# Patient Record
Sex: Male | Born: 1970 | Race: White | Hispanic: No | Marital: Married | Smoking: Never smoker
Health system: Southern US, Community
[De-identification: ages and names within clinical notes are randomized; demographics above are authoritative.]

## PROBLEM LIST (undated history)

## (undated) DIAGNOSIS — N289 Disorder of kidney and ureter, unspecified: Secondary | ICD-10-CM

---

## 2003-09-14 ENCOUNTER — Emergency Department (HOSPITAL_COMMUNITY): Admission: EM | Admit: 2003-09-14 | Discharge: 2003-09-14 | Payer: Self-pay | Admitting: Emergency Medicine

## 2004-03-08 ENCOUNTER — Ambulatory Visit (HOSPITAL_COMMUNITY): Admission: RE | Admit: 2004-03-08 | Discharge: 2004-03-08 | Payer: Self-pay | Admitting: Family Medicine

## 2006-01-26 ENCOUNTER — Emergency Department (HOSPITAL_COMMUNITY): Admission: EM | Admit: 2006-01-26 | Discharge: 2006-01-26 | Payer: Self-pay | Admitting: Family Medicine

## 2008-02-02 ENCOUNTER — Emergency Department (HOSPITAL_BASED_OUTPATIENT_CLINIC_OR_DEPARTMENT_OTHER): Admission: EM | Admit: 2008-02-02 | Discharge: 2008-02-02 | Payer: Self-pay | Admitting: Emergency Medicine

## 2015-02-24 ENCOUNTER — Encounter (HOSPITAL_COMMUNITY): Payer: Self-pay | Admitting: Emergency Medicine

## 2015-02-24 ENCOUNTER — Emergency Department (HOSPITAL_COMMUNITY)
Admission: EM | Admit: 2015-02-24 | Discharge: 2015-02-24 | Disposition: A | Discharge status: Home / Self Care | Payer: Self-pay | Attending: Emergency Medicine | Admitting: Emergency Medicine

## 2015-02-24 ENCOUNTER — Emergency Department (HOSPITAL_COMMUNITY): Payer: Self-pay

## 2015-02-24 DIAGNOSIS — N201 Calculus of ureter: Secondary | ICD-10-CM | POA: Insufficient documentation

## 2015-02-24 HISTORY — DX: Disorder of kidney and ureter, unspecified: N28.9

## 2015-02-24 LAB — URINALYSIS, ROUTINE W REFLEX MICROSCOPIC
Bilirubin Urine: NEGATIVE
Glucose, UA: NEGATIVE mg/dL
Leukocytes, UA: NEGATIVE
Nitrite: NEGATIVE
Specific Gravity, Urine: 1.025 (ref 1.005–1.030)
Urobilinogen, UA: 0.2 mg/dL (ref 0.0–1.0)
pH: 6.5 (ref 5.0–8.0)

## 2015-02-24 LAB — URINE MICROSCOPIC-ADD ON

## 2015-02-24 LAB — CBC WITH DIFFERENTIAL/PLATELET
Basophils Absolute: 0 10*3/uL (ref 0.0–0.1)
Basophils Relative: 0 %
Eosinophils Absolute: 0.1 10*3/uL (ref 0.0–0.7)
Eosinophils Relative: 1 %
HCT: 42.8 % (ref 39.0–52.0)
Hemoglobin: 14.8 g/dL (ref 13.0–17.0)
Lymphocytes Relative: 11 %
Lymphs Abs: 1.7 10*3/uL (ref 0.7–4.0)
MCH: 31.6 pg (ref 26.0–34.0)
MCHC: 34.6 g/dL (ref 30.0–36.0)
MCV: 91.3 fL (ref 78.0–100.0)
Monocytes Absolute: 1.5 10*3/uL — ABNORMAL HIGH (ref 0.1–1.0)
Monocytes Relative: 9 %
Neutro Abs: 12.9 10*3/uL — ABNORMAL HIGH (ref 1.7–7.7)
Neutrophils Relative %: 79 %
Platelets: 314 10*3/uL (ref 150–400)
RBC: 4.69 MIL/uL (ref 4.22–5.81)
RDW: 12.7 % (ref 11.5–15.5)
WBC: 16.2 10*3/uL — ABNORMAL HIGH (ref 4.0–10.5)

## 2015-02-24 LAB — BASIC METABOLIC PANEL
Anion gap: 6 (ref 5–15)
BUN: 20 mg/dL (ref 6–20)
CO2: 25 mmol/L (ref 22–32)
Calcium: 8.6 mg/dL — ABNORMAL LOW (ref 8.9–10.3)
Chloride: 107 mmol/L (ref 101–111)
Creatinine, Ser: 1.34 mg/dL — ABNORMAL HIGH (ref 0.61–1.24)
GFR calc Af Amer: >60 mL/min (ref >60)
GFR calc non Af Amer: >60 mL/min (ref >60)
Glucose, Bld: 97 mg/dL (ref 65–99)
Potassium: 3.9 mmol/L (ref 3.5–5.1)
Sodium: 138 mmol/L (ref 135–145)

## 2015-02-24 MED ORDER — OXYCODONE-ACETAMINOPHEN 5-325 MG PO TABS: 2.0000 | ORAL_TABLET | ORAL | Status: AC | PRN

## 2015-02-24 MED ORDER — TAMSULOSIN HCL 0.4 MG PO CAPS: 0.4000 mg | ORAL_CAPSULE | Freq: Every day | ORAL | Status: AC

## 2015-02-24 MED ORDER — TAMSULOSIN HCL 0.4 MG PO CAPS
0.4000 mg | ORAL_CAPSULE | Freq: Once | ORAL | Status: AC
  Administered 2015-02-24: 0.4 mg via ORAL
  Filled 2015-02-24: qty 1

## 2015-02-24 MED ORDER — MORPHINE SULFATE (PF) 4 MG/ML IV SOLN
4.0000 mg | INTRAVENOUS | Status: DC | PRN
  Administered 2015-02-24: 4 mg via INTRAVENOUS
  Filled 2015-02-24: qty 1

## 2015-02-24 MED ORDER — CIPROFLOXACIN HCL 500 MG PO TABS: 500.0000 mg | ORAL_TABLET | Freq: Two times a day (BID) | ORAL | Status: AC

## 2015-02-24 MED ORDER — KETOROLAC TROMETHAMINE 30 MG/ML IJ SOLN
30.0000 mg | Freq: Once | INTRAMUSCULAR | Status: AC
  Administered 2015-02-24: 30 mg via INTRAVENOUS
  Filled 2015-02-24: qty 1

## 2015-02-24 MED ORDER — ONDANSETRON 4 MG PO TBDP: 4.0000 mg | ORAL_TABLET | Freq: Three times a day (TID) | ORAL | Status: AC | PRN

## 2015-02-24 MED ORDER — SODIUM CHLORIDE 0.9 % IV BOLUS (SEPSIS)
1000.0000 mL | Freq: Once | INTRAVENOUS | Status: AC
  Administered 2015-02-24: 1000 mL via INTRAVENOUS

## 2015-02-24 MED ORDER — ONDANSETRON HCL 4 MG/2ML IJ SOLN
4.0000 mg | Freq: Once | INTRAMUSCULAR | Status: AC
  Administered 2015-02-24: 4 mg via INTRAVENOUS
  Filled 2015-02-24: qty 2

## 2015-02-24 NOTE — ED Provider Notes (Signed)
CSN: [id number]     Arrival date & time 02/24/15  1750 History   First MD Initiated Contact with Patient 02/24/15 2042     Chief Complaint  Patient presents with  . Back Pain     HPI  Patient was evaluation of flank pain and abdominal pain for the last 3 weeks. History previous kidney stone in [place] many years ago pass it after 2-3 days. Did not require intervention. Similar pain starting 2 weeks ago. Has good days and bad days. Pain is been intermittent. Does not feel as though it is never completely gone away. No fever shakes chills. Started feeling poorly again yesterday with pain presents here. No dysuria frequency or gross hematuria. Nausea and vomiting today, thus he presents here.  Past Medical History  Diagnosis Date  . Renal disorder    History reviewed. No pertinent past surgical history. History reviewed. No pertinent family history. Social History  Substance Use Topics  . Smoking status: Never Smoker   . Smokeless tobacco: None  . Alcohol Use: Yes     Comment: occasional    Review of Systems  Constitutional: Negative for fever, chills, diaphoresis, appetite change and fatigue.  HENT: Negative for mouth sores, sore throat and trouble swallowing.   Eyes: Negative for visual disturbance.  Respiratory: Negative for cough, chest tightness, shortness of breath and wheezing.   Cardiovascular: Negative for chest pain.  Gastrointestinal: Positive for nausea and vomiting. Negative for abdominal pain, diarrhea and abdominal distention.  Endocrine: Negative for polydipsia, polyphagia and polyuria.  Genitourinary: Positive for flank pain. Negative for dysuria, frequency and hematuria.  Musculoskeletal: Negative for gait problem.  Skin: Negative for color change, pallor and rash.  Neurological: Negative for dizziness, syncope, light-headedness and headaches.  Hematological: Does not bruise/bleed easily.  Psychiatric/Behavioral: Negative for behavioral problems and confusion.       Allergies  Review of patient's allergies indicates not on file.  Home Medications   Prior to Admission medications   Not on File   BP 120/71 mmHg  Pulse 64  Temp(Src) 98.5 F (36.9 C) (Oral)  Resp 20  Ht  (1.753 m)  Wt 165 lb (74.844 kg)  BMI 24.36 kg/m2  SpO2 97% Physical Exam  Constitutional: He is oriented to person, place, and time. He appears well-developed and well-nourished. No distress.  Appears uncomfortable. Not frankly distressed. He will lie still in bed.  HENT:  Head: Normocephalic.  Eyes: Conjunctivae are normal. Pupils are equal, round, and reactive to light. No scleral icterus.  Neck: Normal range of motion. Neck supple. No thyromegaly present.  Cardiovascular: Normal rate and regular rhythm.  Exam reveals no gallop and no friction rub.   No murmur heard. Pulmonary/Chest: Effort normal and breath sounds normal. No respiratory distress. He has no wheezes. He has no rales.  Abdominal: Soft. Bowel sounds are normal. He exhibits no distension. There is no tenderness. There is no rebound.    Musculoskeletal: Normal range of motion.  Neurological: He is alert and oriented to person, place, and time.  Skin: Skin is warm and dry. No rash noted.  Psychiatric: He has a normal mood and affect. His behavior is normal.    ED Course  Procedures (including critical care time) Labs Review Labs Reviewed  CBC WITH DIFFERENTIAL/PLATELET - Abnormal; Notable for the following:    WBC 16.2 (*)    Neutro Abs 12.9 (*)    Monocytes Absolute 1.5 (*)    All other components within normal limits  BASIC  METABOLIC PANEL - Abnormal; Notable for the following:    Creatinine, Ser 1.34 (*)    Calcium 8.6 (*)    All other components within normal limits  URINALYSIS, ROUTINE W REFLEX MICROSCOPIC (NOT AT [facility])    Imaging Review Ct Renal Stone Study  02/24/2015   CLINICAL DATA:  44 year old male with left flank pain  EXAM: CT ABDOMEN AND PELVIS WITHOUT CONTRAST   TECHNIQUE: Multidetector CT imaging of the abdomen and pelvis was performed following the standard protocol without IV contrast.  COMPARISON:  None.  FINDINGS: Evaluation of this exam is limited in the absence of intravenous contrast.  The visualized lung bases are clear. No intra-abdominal free air or free fluid.  The liver, gallbladder, pancreas, spleen, adrenal glands, right kidney and right ureter, the urinary bladder, and prostate gland appear grossly unremarkable. There is a 4 mm left mid ureteral stone with mild left hydronephrosis. Mild left perinephric stranding noted.  Sigmoid diverticulosis without active inflammation. There are scattered colonic diverticula. Constipation. No bowel obstruction. Multiple normal caliber fecalized loops of small bowel noted suggestive of chronic stasis. Normal appendix.  The abdominal aorta and IVC appear grossly unremarkable on this noncontrast study. No portal venous gas identified. There is no adenopathy.  The abdominal wall soft tissues appear unremarkable. The osseous structures are intact.  IMPRESSION: A 4 mm mid left ureteral stone with mild left hydronephrosis. Correlation with urinalysis recommended to exclude superimposed UTI.   Electronically Signed   By: Elgie Collard M.D.   On: 02/24/2015 21:54   I have personally reviewed and evaluated these images and lab results as part of my medical decision-making.   EKG Interpretation None      MDM   Final diagnoses:  Ureteral stone    CT shows 4 mm left mid ureteral stone with hydronephrosis and perinephric stranding. White count 16. Urine pending. After IV meds he is comfortable. Will await urinalysis for signs of infection. May require neurological discussion tonight. Overall does not appear septic toxic. Is afebrile. Now tolerating symptoms quite well.    Rolland Porter, MD 02/24/15 2220

## 2015-02-24 NOTE — ED Provider Notes (Signed)
Dr. Fayrene Fearing asked me to check the urinalysis prior to discharge. Urinalysis does show bacteria with rare squamous cells, potentially consistent with UTI. I reviewed findings and other findings. It would be prudent to treat him with antibiotic pending urine culture results, which has been ordered. Dr. Fayrene Fearing wrote him for Cipro.  Mancel Bale, MD 02/24/15 [phone]

## 2015-02-24 NOTE — ED Notes (Signed)
Pt reports lower back pain radiating to left groin x3 weeks. Pt reports emesis x1, urinary frequency,dysuria.

## 2015-02-24 NOTE — Discharge Instructions (Signed)

## 2015-02-24 NOTE — ED Notes (Signed)
MD at bedside. 

## 2015-02-24 NOTE — ED Notes (Signed)
Patient was given a pre-package of Percocet quality six and given instructions on used, patient verbally understands, patient discharge home accompanied by wife, vital signs stable.

## 2015-02-26 LAB — URINE CULTURE: Culture: NO GROWTH

## 2015-03-03 MED FILL — Oxycodone w/ Acetaminophen Tab 5-325 MG: ORAL | Qty: 6 | Status: AC

## 2016-02-12 IMAGING — CT CT RENAL STONE PROTOCOL
3 of 4 series · 7 of 46 positions shown, 13 images · non-contrast
Comparison: None.

CLINICAL DATA: 43-year-old male with left flank pain

EXAM:
CT ABDOMEN AND PELVIS WITHOUT CONTRAST
TECHNIQUE: Multidetector CT imaging of the abdomen and pelvis was performed
following the standard protocol without IV contrast.

[Series 3: lung 5.0 b60f · axial · 0.67mm/px · z∈[-122,-82]mm · 3 of 18 slices shown, 7 images]
[im 5/18  soft-tissue]
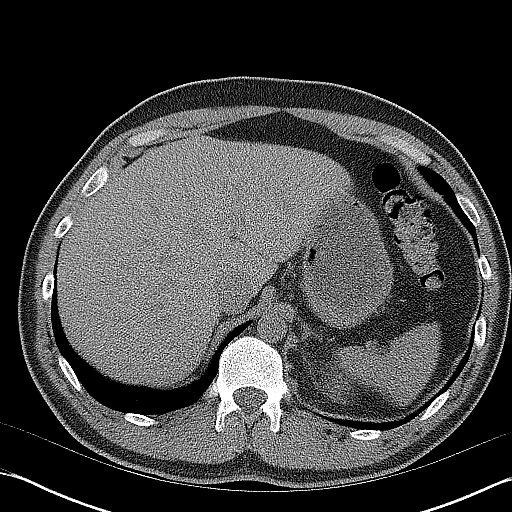
[im 5/18  lung]
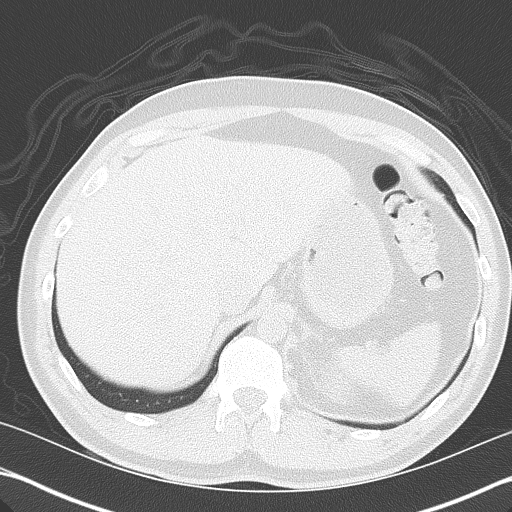
[im 5/18  bone]
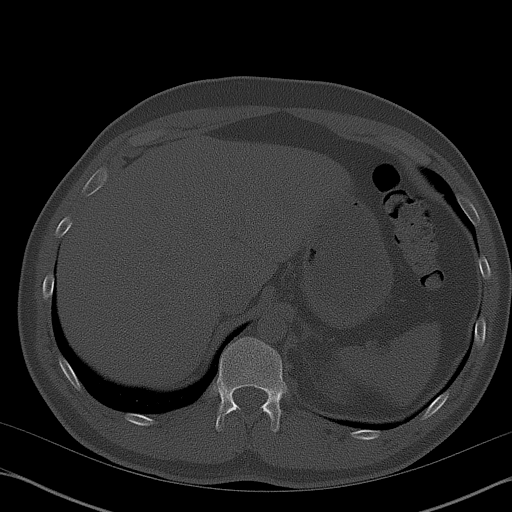
[im 9/18  soft-tissue]
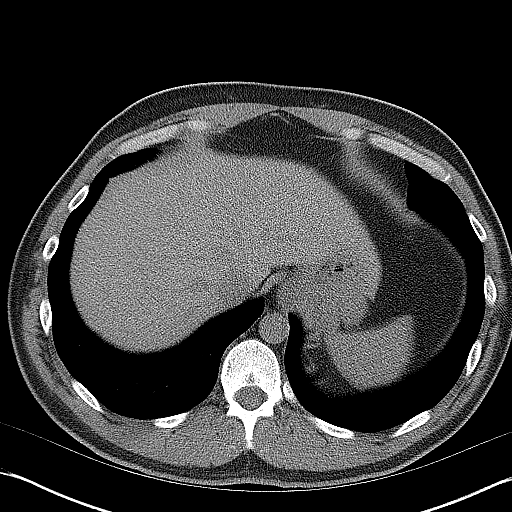
[im 9/18  lung]
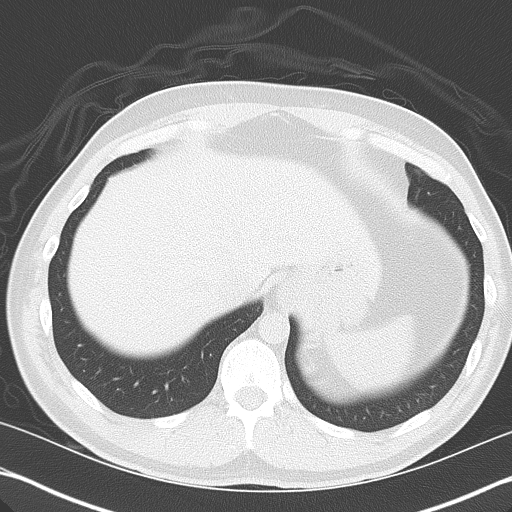
[im 13/18  soft-tissue]
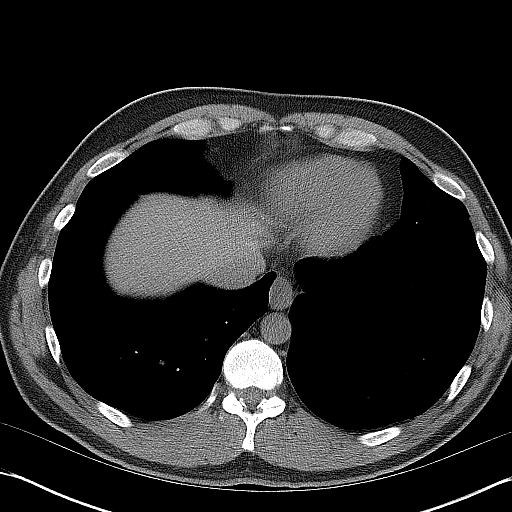
[im 13/18  lung]
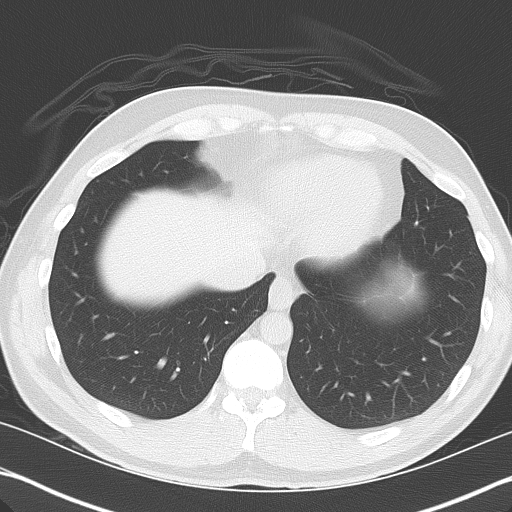

[Series 4: mpr coronal 3.0mm · coronal · 0.68mm/px · 3 of 92 slices shown, 4 images]
[im 31/92  soft-tissue]
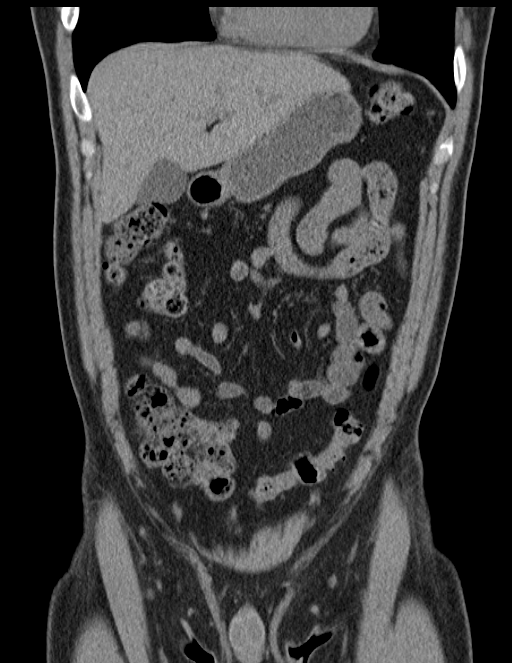
[im 41/92  soft-tissue]
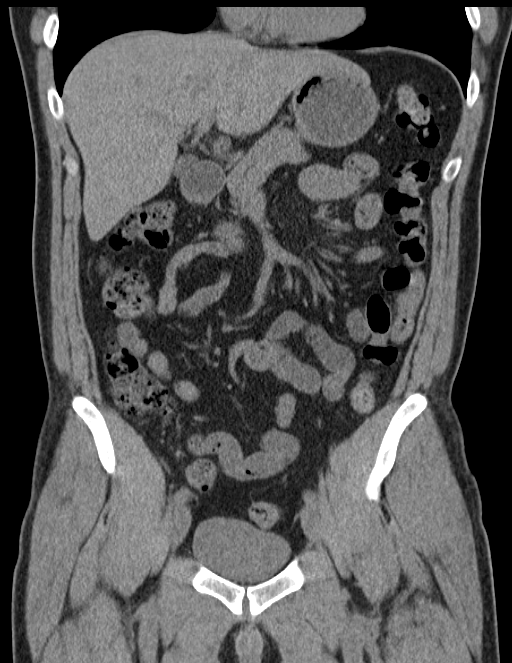
[im 41/92  bone]
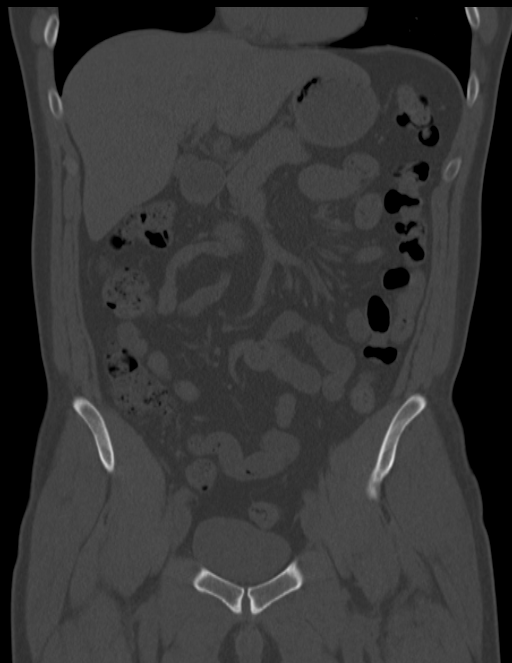
[im 51/92  soft-tissue]
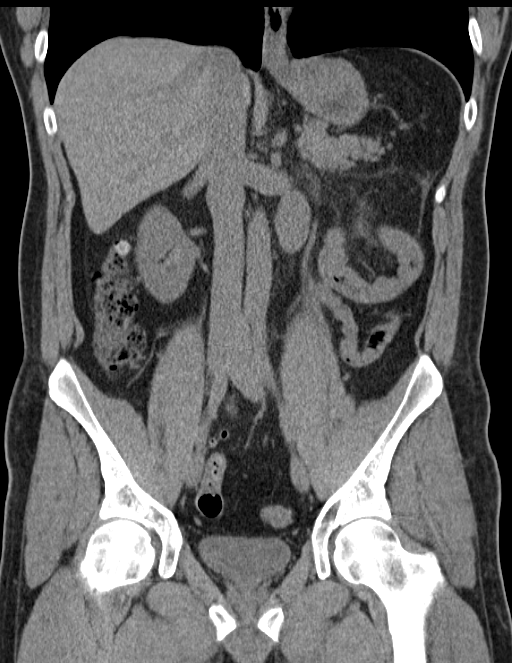

[Series 5: mpr sagittal 3.0mm · sagittal · 0.60mm/px · 1 of 109 slices shown, 2 images]
[im 37/109  soft-tissue]
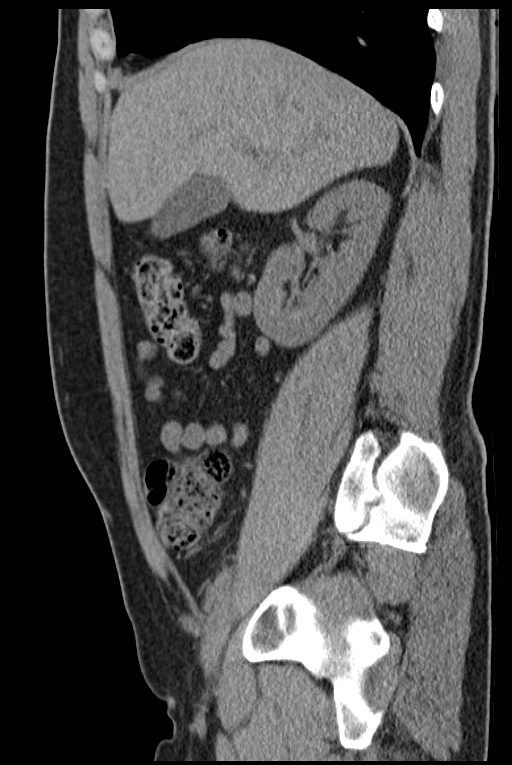
[im 37/109  bone]
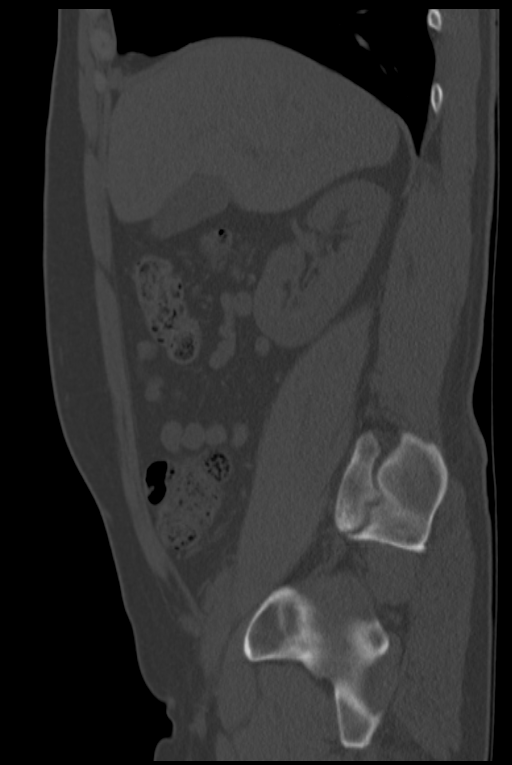

[7 of 46 positions shown; findings below may reference images not displayed]

FINDINGS: Evaluation of this exam is limited in the absence of intravenous
contrast.

The visualized lung bases are clear. No intra-abdominal free air or
free fluid.

The liver, gallbladder, pancreas, spleen, adrenal glands, right
kidney and right ureter, the urinary bladder, and prostate gland
appear grossly unremarkable. There is a 4 mm left mid ureteral stone
with mild left hydronephrosis. Mild left perinephric stranding
noted.

Sigmoid diverticulosis without active inflammation. There are
scattered colonic diverticula. Constipation. No bowel obstruction.
Multiple normal caliber fecalized loops of small bowel noted
suggestive of chronic stasis. Normal appendix.

The abdominal aorta and IVC appear grossly unremarkable on this
noncontrast study. No portal venous gas identified. There is no
adenopathy.

The abdominal wall soft tissues appear unremarkable. The osseous
structures are intact.
IMPRESSION: A 4 mm mid left ureteral stone with mild left hydronephrosis.
Correlation with urinalysis recommended to exclude superimposed UTI.

## 2019-04-19 ENCOUNTER — Other Ambulatory Visit: Payer: Self-pay

## 2019-04-19 DIAGNOSIS — Z20822 Contact with and (suspected) exposure to covid-19: Secondary | ICD-10-CM

## 2019-04-21 LAB — NOVEL CORONAVIRUS, NAA: SARS-CoV-2, NAA: NOT DETECTED

## 2021-03-02 ENCOUNTER — Emergency Department (HOSPITAL_COMMUNITY)
Admission: EM | Admit: 2021-03-02 | Discharge: 2021-03-02 | Disposition: A | Discharge status: Home / Self Care | Payer: Self-pay | Attending: Emergency Medicine | Admitting: Emergency Medicine

## 2021-03-02 ENCOUNTER — Encounter (HOSPITAL_COMMUNITY): Payer: Self-pay | Admitting: *Deleted

## 2021-03-02 ENCOUNTER — Other Ambulatory Visit: Payer: Self-pay

## 2021-03-02 ENCOUNTER — Emergency Department (HOSPITAL_COMMUNITY): Payer: Self-pay

## 2021-03-02 DIAGNOSIS — M25511 Pain in right shoulder: Secondary | ICD-10-CM | POA: Insufficient documentation

## 2021-03-02 DIAGNOSIS — G8929 Other chronic pain: Secondary | ICD-10-CM | POA: Insufficient documentation

## 2021-03-02 MED ORDER — NAPROXEN 500 MG PO TABS: 500.0000 mg | ORAL_TABLET | Freq: Two times a day (BID) | ORAL | 0 refills | Status: AC

## 2021-03-02 MED ORDER — CYCLOBENZAPRINE HCL 5 MG PO TABS: 5.0000 mg | ORAL_TABLET | Freq: Three times a day (TID) | ORAL | 0 refills | Status: AC | PRN

## 2021-03-02 NOTE — Discharge Instructions (Addendum)
Use the medications prescribed for your pain and muscle soreness.  Use an ice pack as needed to help reduce inflammation, especially after a heavy workday.  I also recommend a heating pad applied 20 minutes 2-3 times daily which can help reduce muscle spasm and strain.  Recommend follow-up with orthopedics for further evaluation, although your x-ray is negative it is possible that you have a soft tissue injury such as a rotator cuff injury or chronic strain.  If simple treatments do not improve your symptoms you may need further imaging such as an MRI.  You may discuss this with Dr. Romeo Apple who will order that if he feels it would be helpful.

## 2021-03-02 NOTE — ED Provider Notes (Signed)
[facility] EMERGENCY DEPARTMENT Provider Note   CSN: [id number] Arrival date & time: 03/02/21  1243     History Chief Complaint  Patient presents with   Shoulder Injury    Todd Wang is a 50 y.o. 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  He denies injury.  His pain is deep and aching, worsening with certain movements especially overhead movement and reaching behind the back.  He denies weakness or numbness in the arms or hands.  Denies neck pain but has soreness across the right shoulder with rightward neck rotation.  He has had no treatments prior to arrival.  He has attempted different body positions and propping on pillows for sleep (normally a side sleeper).  Denies fevers, chills, no sob, no cp.   The history is provided by the patient.      Past Medical History:  Diagnosis Date   Renal disorder     There are no problems to display for this patient.   History reviewed. No pertinent surgical history.     No family history on file.  Social History   Tobacco Use   Smoking status: Never   Smokeless tobacco: Never  Substance Use Topics   Alcohol use: Not Currently    Comment: occasional    Home Medications Prior to Admission medications   Medication Sig Start Date End Date Taking? Authorizing Provider  cyclobenzaprine (FLEXERIL) 5 MG tablet Take 1 tablet (5 mg total) by mouth 3 (three) times daily as needed for muscle spasms. 03/02/21  Yes Sami Roes, Raynelle Fanning, PA-C  naproxen (NAPROSYN) 500 MG tablet Take 1 tablet (500 mg total) by mouth 2 (two) times daily. 03/02/21  Yes Dabria Wadas, Raynelle Fanning, PA-C  ciprofloxacin (CIPRO) 500 MG tablet Take 1 tablet (500 mg total) by mouth every 12 (twelve) hours. 02/24/15   Rolland Porter, MD  ondansetron (ZOFRAN ODT) 4 MG disintegrating tablet Take 1 tablet (4 mg total) by mouth every 8 (eight) hours as needed for nausea. 02/24/15   Rolland Porter, MD  oxyCODONE-acetaminophen (PERCOCET/ROXICET) 5-325 MG per tablet Take 2 tablets by mouth every 4 (four) hours as needed. 02/24/15   Rolland Porter, MD  oxyCODONE-acetaminophen (PERCOCET/ROXICET) 5-325 MG per tablet Take 2 tablets by mouth every 4 (four) hours as needed for severe pain. 02/24/15   Mancel Bale, MD  tamsulosin (FLOMAX) 0.4 MG CAPS capsule Take 1 capsule (0.4 mg total) by mouth daily. 02/24/15   Rolland Porter, MD    Allergies    Patient has no known allergies.  Review of Systems   Review of Systems  Constitutional:  Negative for chills and fever.  Respiratory: Negative.    Cardiovascular: Negative.   Musculoskeletal:  Positive for arthralgias. Negative for joint swelling, myalgias, neck pain and neck stiffness.  Neurological:  Negative for weakness and numbness.  All other systems reviewed and are negative.  Physical Exam Updated Vital Signs BP (!) 134/92 (BP Location: Left Arm)   Pulse (!) 59   Temp 98.3 F (36.8 C) (Oral)   Resp 16   Ht 5\' 9"  (1.753 m)   Wt 75 kg   SpO2 99%   BMI 24.42 kg/m   Physical Exam Constitutional:      Appearance: He is well-developed.  HENT:     Head: Atraumatic.  Cardiovascular:     Pulses:          Radial pulses are 2+ on  the right side and 2+ on the left side.     Comments: Pulses equal bilaterally Musculoskeletal:        General: Tenderness present. No swelling or deformity.     Right shoulder: No swelling, deformity, effusion or crepitus. Decreased range of motion. Normal strength. Normal pulse.     Cervical back: Normal range of motion.     Comments: Ttp across superior and posterior shoulder joint to superior scapular ridge.  No deformity, no crepitus with active and passive ROM.  No pain with c spine rotation.  No pain with elbow and wrist flex/ext.   Skin:    General: Skin is warm and dry.  Neurological:     Mental Status: He is alert.     Sensory: Sensation is intact. No sensory deficit.     Motor: No weakness.      Deep Tendon Reflexes: Reflexes normal.     Comments: Equal grip strength,     ED Results / Procedures / Treatments   Labs (all labs ordered are listed, but only abnormal results are displayed) Labs Reviewed - No data to display  EKG None  Radiology DG Shoulder Right  Result Date: 03/02/2021 CLINICAL DATA:  Shoulder pain, no reported history of injury EXAM: RIGHT SHOULDER - 2+ VIEW COMPARISON:  X-ray right shoulder dated September 14, 2003 FINDINGS: There is no evidence of fracture or dislocation. Minimal degenerative changes of the acromioclavicular joint. No glenohumeral arthropathy. Soft tissues are unremarkable. IMPRESSION: No acute osseous abnormality. Electronically Signed   By: Allegra Lai M.D.   On: 03/02/2021 14:18    Procedures Procedures   Medications Ordered in ED Medications - No data to display  ED Course  I have reviewed the triage vital signs and the nursing notes.  Pertinent labs & imaging results that were available during my care of the patient were reviewed by me and considered in my medical decision making (see chart for details).    MDM Rules/Calculators/A&P                           Normal exam except for tenderness to palpation along the superior right shoulder and posterior shoulder joint.  No obvious weakness, no crepitus or joint effusion.  Shoulder x-ray was reviewed and discussed with patient, no effusion, no arthritis changes.  I suspect that this may be a soft tissue strain versus rotator cuff injury from overuse, patient denies any obvious injuries or falls.  He was placed on naproxen and Flexeril, we discussed the roles of ice and heat.  Offered a sling for comfort, patient deferred.  He was given referral to orthopedics for follow-up care. Final Clinical Impression(s) / ED Diagnoses Final diagnoses:  Chronic right shoulder pain    Rx / DC Orders ED Discharge Orders          Ordered    naproxen (NAPROSYN) 500 MG tablet  2 times daily         03/02/21 1457    cyclobenzaprine (FLEXERIL) 5 MG tablet  3 times daily PRN        03/02/21 1457             Burgess Amor, PA-C 03/02/21 1508    Long, Arlyss Repress, MD 03/05/21 1106

## 2021-03-02 NOTE — ED Triage Notes (Signed)
Pain in right shoulder for quite sometime

## 2021-03-02 NOTE — ED Notes (Signed)
Pt c/o r shoulder pain x 2 months.  Reports has been progressively getting worse.  Denies any known injury.

## 2022-02-18 IMAGING — DX DG SHOULDER 2+V*R*
3 series · 3 of 3 positions shown · non-contrast
Comparison: X-ray right shoulder dated September 14, 2003

CLINICAL DATA: Shoulder pain, no reported history of injury

EXAM:
RIGHT SHOULDER - 2+ VIEW

[shoulder grashey]
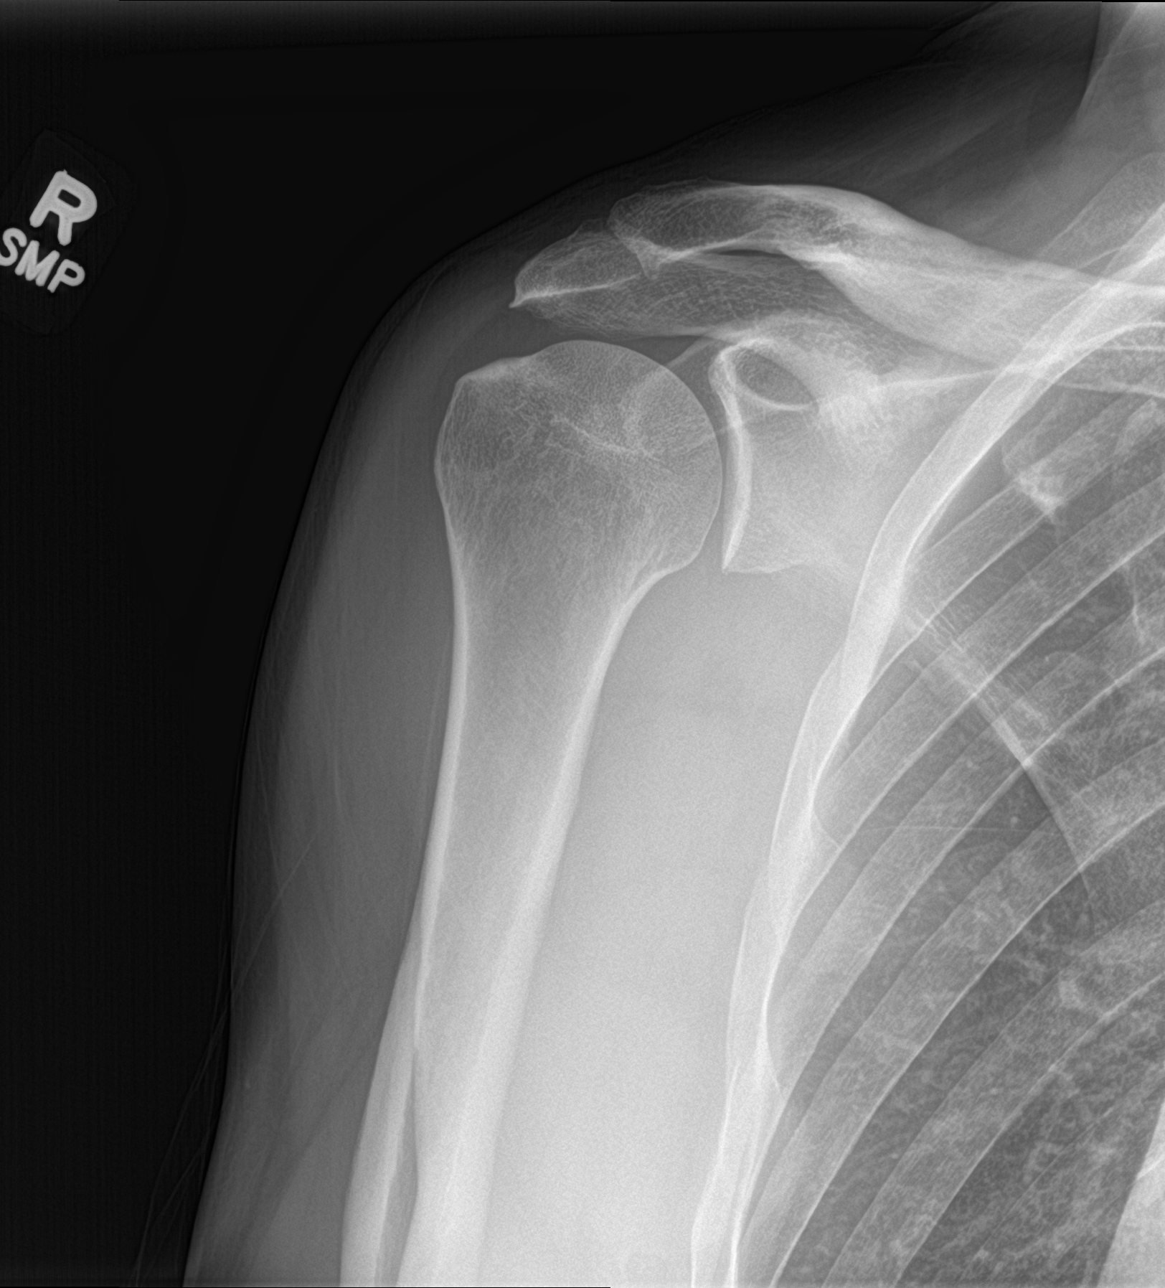

[shoulder y view]
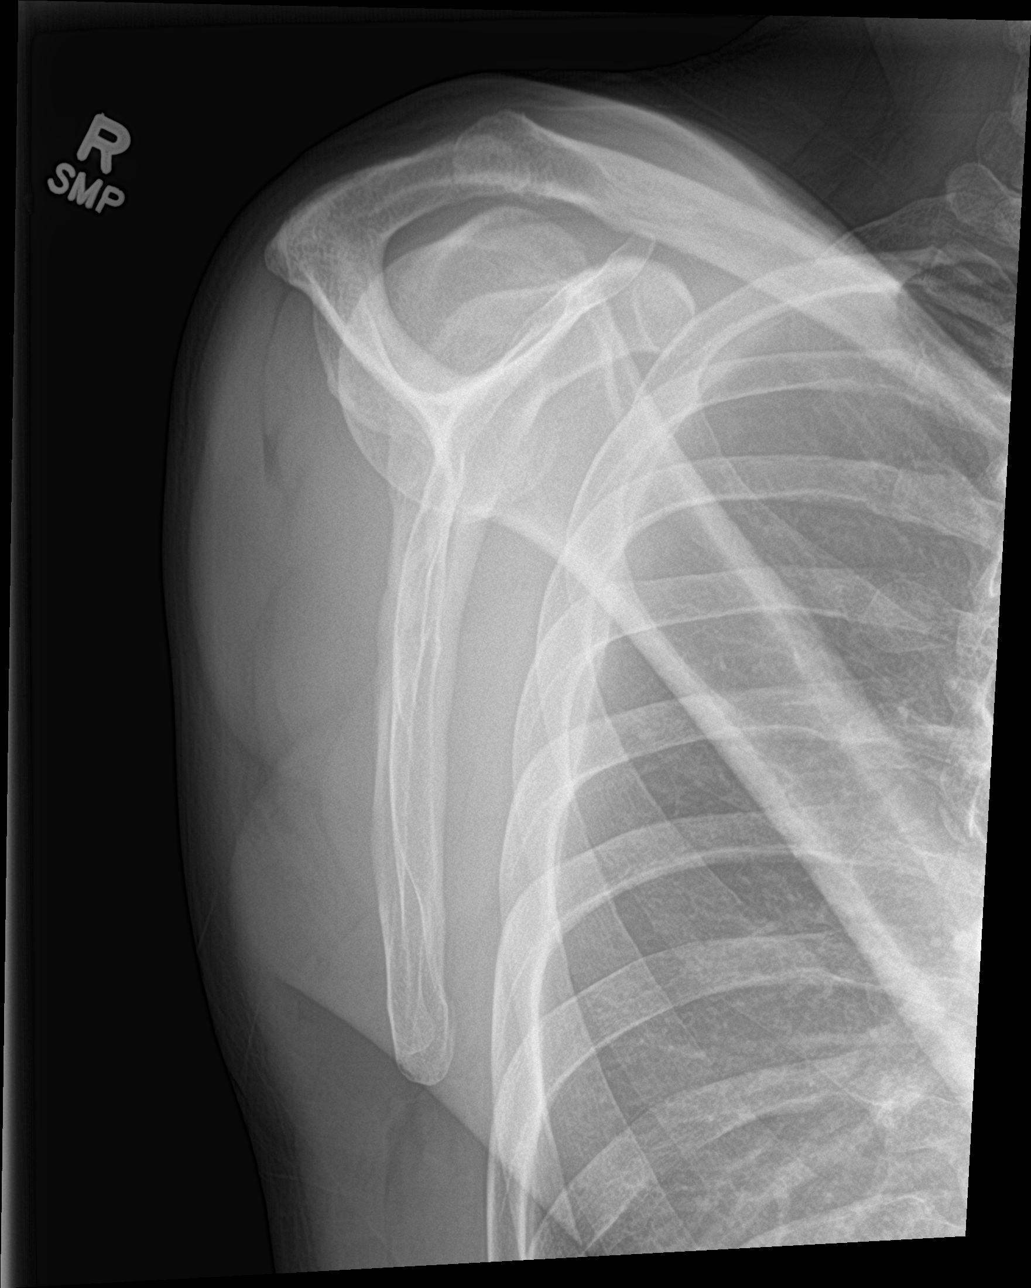

[shoulder axillary]
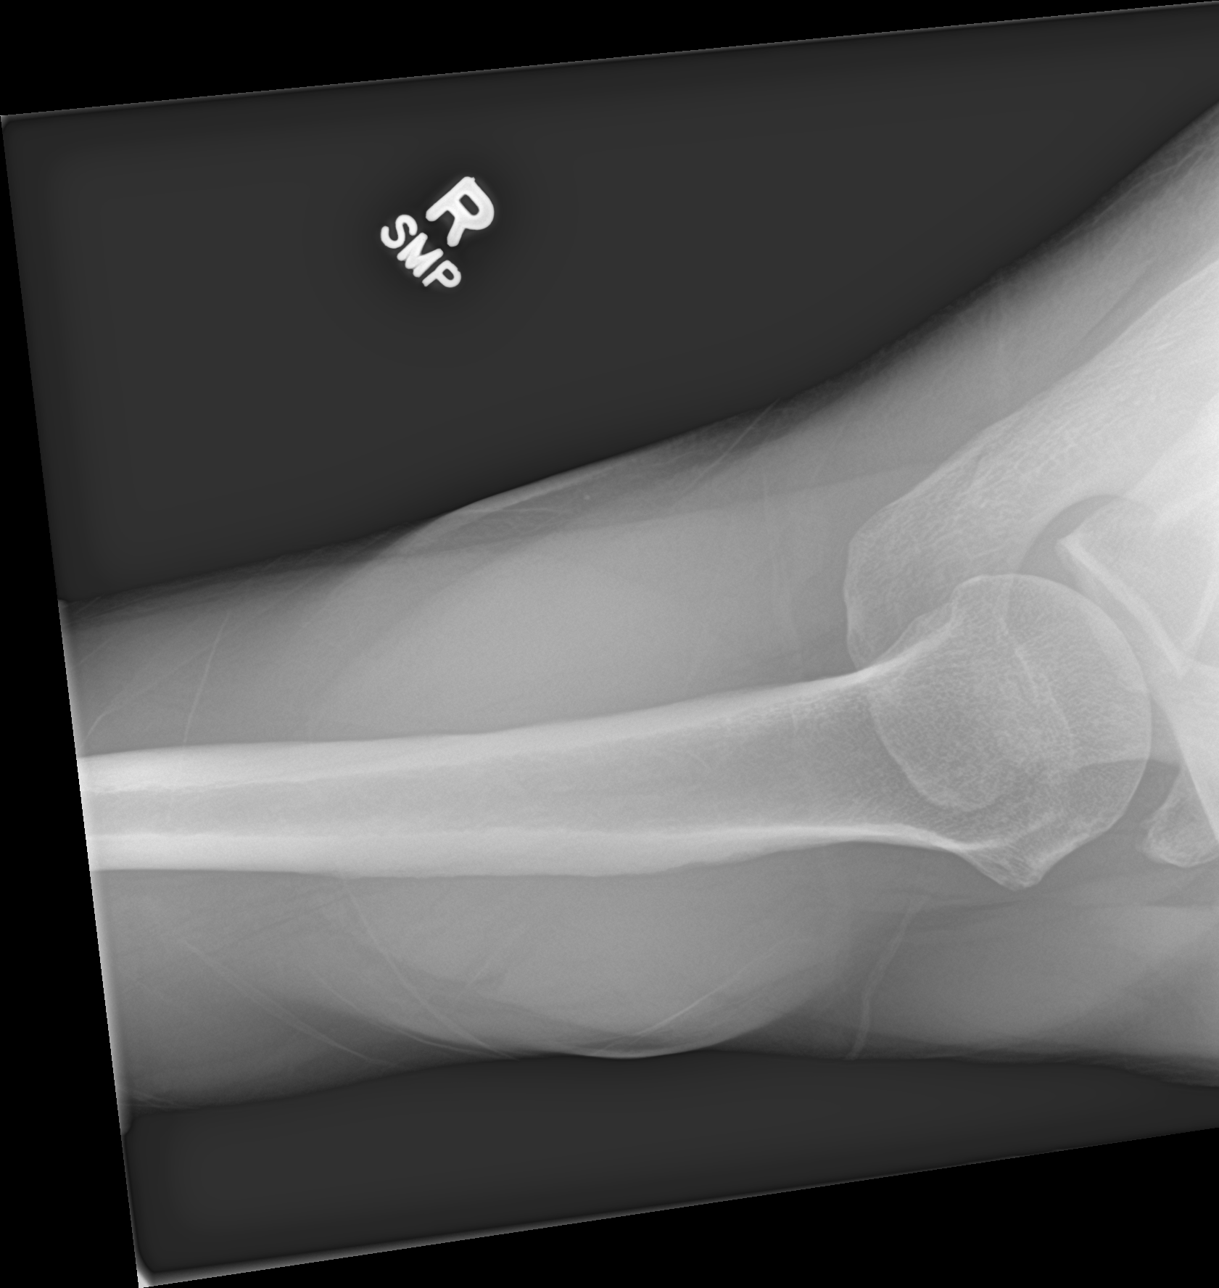

[3 of 3 positions shown; findings below may reference images not displayed]

FINDINGS: There is no evidence of fracture or dislocation. Minimal
degenerative changes of the acromioclavicular joint. No glenohumeral
arthropathy. Soft tissues are unremarkable.
IMPRESSION: No acute osseous abnormality.
